# Patient Record
Sex: Male | Born: 1955 | Race: Black or African American | Hispanic: No | Marital: Single | Smoking: Never smoker
Health system: Southern US, Community
[De-identification: ages and names within clinical notes are randomized; demographics above are authoritative.]

## PROBLEM LIST (undated history)

## (undated) DIAGNOSIS — I10 Essential (primary) hypertension: Secondary | ICD-10-CM

---

## 2015-02-20 ENCOUNTER — Ambulatory Visit
Admission: RE | Admit: 2015-02-20 | Discharge: 2015-02-20 | Disposition: A | Discharge status: Home / Self Care | Payer: Worker's Compensation | Source: Ambulatory Visit | Attending: Nurse Practitioner | Admitting: Nurse Practitioner

## 2015-02-20 ENCOUNTER — Other Ambulatory Visit: Payer: Self-pay | Admitting: Nurse Practitioner

## 2015-02-20 DIAGNOSIS — M25562 Pain in left knee: Secondary | ICD-10-CM

## 2015-02-20 DIAGNOSIS — W19XXXA Unspecified fall, initial encounter: Secondary | ICD-10-CM

## 2015-02-20 DIAGNOSIS — M25462 Effusion, left knee: Secondary | ICD-10-CM

## 2020-02-22 ENCOUNTER — Emergency Department (HOSPITAL_COMMUNITY): Payer: No Typology Code available for payment source

## 2020-02-22 ENCOUNTER — Encounter (HOSPITAL_COMMUNITY): Payer: Self-pay | Admitting: Emergency Medicine

## 2020-02-22 ENCOUNTER — Emergency Department (HOSPITAL_COMMUNITY)
Admission: EM | Admit: 2020-02-22 | Discharge: 2020-02-22 | Disposition: A | Discharge status: Home / Self Care | Payer: No Typology Code available for payment source | Attending: Emergency Medicine | Admitting: Emergency Medicine

## 2020-02-22 ENCOUNTER — Other Ambulatory Visit: Payer: Self-pay

## 2020-02-22 DIAGNOSIS — M25531 Pain in right wrist: Secondary | ICD-10-CM | POA: Diagnosis not present

## 2020-02-22 DIAGNOSIS — M25532 Pain in left wrist: Secondary | ICD-10-CM | POA: Diagnosis not present

## 2020-02-22 DIAGNOSIS — Y929 Unspecified place or not applicable: Secondary | ICD-10-CM | POA: Insufficient documentation

## 2020-02-22 DIAGNOSIS — Y999 Unspecified external cause status: Secondary | ICD-10-CM | POA: Diagnosis not present

## 2020-02-22 DIAGNOSIS — M25552 Pain in left hip: Secondary | ICD-10-CM | POA: Insufficient documentation

## 2020-02-22 DIAGNOSIS — S93401A Sprain of unspecified ligament of right ankle, initial encounter: Secondary | ICD-10-CM | POA: Diagnosis not present

## 2020-02-22 DIAGNOSIS — T148XXA Other injury of unspecified body region, initial encounter: Secondary | ICD-10-CM | POA: Diagnosis not present

## 2020-02-22 DIAGNOSIS — T07XXXA Unspecified multiple injuries, initial encounter: Secondary | ICD-10-CM | POA: Diagnosis present

## 2020-02-22 DIAGNOSIS — Y939 Activity, unspecified: Secondary | ICD-10-CM | POA: Diagnosis not present

## 2020-02-22 DIAGNOSIS — R0789 Other chest pain: Secondary | ICD-10-CM | POA: Insufficient documentation

## 2020-02-22 DIAGNOSIS — M25551 Pain in right hip: Secondary | ICD-10-CM | POA: Diagnosis not present

## 2020-02-22 HISTORY — DX: Essential (primary) hypertension: I10

## 2020-02-22 LAB — CBC WITH DIFFERENTIAL/PLATELET
Abs Immature Granulocytes: 0.07 10*3/uL (ref 0.00–0.07)
Basophils Absolute: 0.1 10*3/uL (ref 0.0–0.1)
Basophils Relative: 1 %
Eosinophils Absolute: 0.4 10*3/uL (ref 0.0–0.5)
Eosinophils Relative: 3 %
HCT: 49.1 % (ref 39.0–52.0)
Hemoglobin: 15.5 g/dL (ref 13.0–17.0)
Immature Granulocytes: 1 %
Lymphocytes Relative: 28 %
Lymphs Abs: 3.1 10*3/uL (ref 0.7–4.0)
MCH: 29.4 pg (ref 26.0–34.0)
MCHC: 31.6 g/dL (ref 30.0–36.0)
MCV: 93 fL (ref 80.0–100.0)
Monocytes Absolute: 0.8 10*3/uL (ref 0.1–1.0)
Monocytes Relative: 7 %
Neutro Abs: 6.9 10*3/uL (ref 1.7–7.7)
Neutrophils Relative %: 60 %
Platelets: 341 10*3/uL (ref 150–400)
RBC: 5.28 MIL/uL (ref 4.22–5.81)
RDW: 12.6 % (ref 11.5–15.5)
WBC: 11.4 10*3/uL — ABNORMAL HIGH (ref 4.0–10.5)
nRBC: 0 % (ref 0.0–0.2)

## 2020-02-22 LAB — BASIC METABOLIC PANEL
Anion gap: 10 (ref 5–15)
BUN: 14 mg/dL (ref 8–23)
CO2: 23 mmol/L (ref 22–32)
Calcium: 8.9 mg/dL (ref 8.9–10.3)
Chloride: 105 mmol/L (ref 98–111)
Creatinine, Ser: 1.23 mg/dL (ref 0.61–1.24)
GFR calc Af Amer: >60 mL/min (ref >60)
GFR calc non Af Amer: >60 mL/min (ref >60)
Glucose, Bld: 139 mg/dL — ABNORMAL HIGH (ref 70–99)
Potassium: 4.1 mmol/L (ref 3.5–5.1)
Sodium: 138 mmol/L (ref 135–145)

## 2020-02-22 MED ORDER — MORPHINE SULFATE (PF) 4 MG/ML IV SOLN
4.0000 mg | Freq: Once | INTRAVENOUS | Status: AC
  Administered 2020-02-22: 4 mg via INTRAVENOUS
  Filled 2020-02-22: qty 1

## 2020-02-22 MED ORDER — OXYCODONE-ACETAMINOPHEN 5-325 MG PO TABS: 1.0000 | ORAL_TABLET | ORAL | 0 refills | Status: AC | PRN
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
## 2020-02-22 NOTE — Progress Notes (Signed)
Orthopedic Tech Progress Note Patient Details:  Randall Ortiz [birth year] [id number]  Ortho Devices Type of Ortho Device: Crutches, Ankle Air splint Ortho Device/Splint Location: RLE Ortho Device/Splint Interventions: Ordered, Application   Post Interventions Patient Tolerated: Well Instructions Provided: Care of device   Donald Pore 02/22/2020, 10:53 AM
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
